# Patient Record
Sex: Male | Born: 1950 | Race: White | Hispanic: No | Marital: Married
Health system: Southern US, Community
[De-identification: ages and names within clinical notes are randomized; demographics above are authoritative.]

---

## 2017-09-25 ENCOUNTER — Other Ambulatory Visit (HOSPITAL_COMMUNITY): Payer: Self-pay

## 2017-09-25 ENCOUNTER — Inpatient Hospital Stay
Admission: RE | Admit: 2017-09-25 | Discharge: 2017-10-31 | Disposition: A | Discharge status: Home Health | Payer: Self-pay | Attending: Internal Medicine | Admitting: Internal Medicine

## 2017-09-25 DIAGNOSIS — J9690 Respiratory failure, unspecified, unspecified whether with hypoxia or hypercapnia: Secondary | ICD-10-CM

## 2017-09-25 DIAGNOSIS — I774 Celiac artery compression syndrome: Secondary | ICD-10-CM

## 2017-09-25 DIAGNOSIS — J869 Pyothorax without fistula: Secondary | ICD-10-CM

## 2017-09-25 DIAGNOSIS — Z931 Gastrostomy status: Secondary | ICD-10-CM

## 2017-09-25 DIAGNOSIS — J90 Pleural effusion, not elsewhere classified: Secondary | ICD-10-CM

## 2017-09-25 DIAGNOSIS — T85598A Other mechanical complication of other gastrointestinal prosthetic devices, implants and grafts, initial encounter: Secondary | ICD-10-CM

## 2017-09-25 DIAGNOSIS — J9382 Other air leak: Secondary | ICD-10-CM

## 2017-09-25 DIAGNOSIS — R109 Unspecified abdominal pain: Secondary | ICD-10-CM

## 2017-09-25 MED ORDER — IOPAMIDOL (ISOVUE-300) INJECTION 61%
INTRAVENOUS | Status: AC
  Administered 2017-09-25: 40 mL via JEJUNOSTOMY
  Filled 2017-09-25: qty 50

## 2017-09-26 LAB — CBC WITH DIFFERENTIAL/PLATELET
Basophils Absolute: 0.1 10*3/uL (ref 0.0–0.1)
Basophils Relative: 1 %
Eosinophils Absolute: 0.6 10*3/uL (ref 0.0–0.7)
Eosinophils Relative: 6 %
HCT: 31.4 % — ABNORMAL LOW (ref 39.0–52.0)
HEMOGLOBIN: 10.3 g/dL — AB (ref 13.0–17.0)
LYMPHS PCT: 26 %
Lymphs Abs: 2.8 10*3/uL (ref 0.7–4.0)
MCH: 28.1 pg (ref 26.0–34.0)
MCHC: 32.8 g/dL (ref 30.0–36.0)
MCV: 85.6 fL (ref 78.0–100.0)
MONOS PCT: 13 %
Monocytes Absolute: 1.4 10*3/uL — ABNORMAL HIGH (ref 0.1–1.0)
NEUTROS ABS: 5.7 10*3/uL (ref 1.7–7.7)
Neutrophils Relative %: 54 %
Platelets: 511 10*3/uL — ABNORMAL HIGH (ref 150–400)
RBC: 3.67 MIL/uL — ABNORMAL LOW (ref 4.22–5.81)
RDW: 15.9 % — ABNORMAL HIGH (ref 11.5–15.5)
WBC: 10.6 10*3/uL — ABNORMAL HIGH (ref 4.0–10.5)

## 2017-09-26 LAB — COMPREHENSIVE METABOLIC PANEL
ALBUMIN: 2.2 g/dL — AB (ref 3.5–5.0)
ALT: 22 U/L (ref 17–63)
AST: 34 U/L (ref 15–41)
Alkaline Phosphatase: 147 U/L — ABNORMAL HIGH (ref 38–126)
Anion gap: 10 (ref 5–15)
BUN: 10 mg/dL (ref 6–20)
CHLORIDE: 98 mmol/L — AB (ref 101–111)
CO2: 23 mmol/L (ref 22–32)
CREATININE: 0.86 mg/dL (ref 0.61–1.24)
Calcium: 8.7 mg/dL — ABNORMAL LOW (ref 8.9–10.3)
GFR calc non Af Amer: >60 mL/min (ref >60)
Glucose, Bld: 95 mg/dL (ref 65–99)
Potassium: 4.1 mmol/L (ref 3.5–5.1)
SODIUM: 131 mmol/L — AB (ref 135–145)
Total Bilirubin: 0.9 mg/dL (ref 0.3–1.2)
Total Protein: 6.9 g/dL (ref 6.5–8.1)

## 2017-09-26 LAB — PROTIME-INR
INR: 1.03
Prothrombin Time: 13.4 seconds (ref 11.4–15.2)

## 2017-09-27 ENCOUNTER — Other Ambulatory Visit (HOSPITAL_COMMUNITY): Payer: Self-pay

## 2017-09-27 LAB — BASIC METABOLIC PANEL
ANION GAP: 7 (ref 5–15)
BUN: 11 mg/dL (ref 6–20)
CALCIUM: 8.2 mg/dL — AB (ref 8.9–10.3)
CO2: 25 mmol/L (ref 22–32)
Chloride: 100 mmol/L — ABNORMAL LOW (ref 101–111)
Creatinine, Ser: 0.75 mg/dL (ref 0.61–1.24)
GFR calc Af Amer: >60 mL/min (ref >60)
GFR calc non Af Amer: >60 mL/min (ref >60)
GLUCOSE: 149 mg/dL — AB (ref 65–99)
Potassium: 4.1 mmol/L (ref 3.5–5.1)
Sodium: 132 mmol/L — ABNORMAL LOW (ref 135–145)

## 2017-09-28 ENCOUNTER — Other Ambulatory Visit (HOSPITAL_BASED_OUTPATIENT_CLINIC_OR_DEPARTMENT_OTHER): Payer: Self-pay

## 2017-09-28 ENCOUNTER — Encounter: Payer: Self-pay | Admitting: *Deleted

## 2017-09-28 ENCOUNTER — Other Ambulatory Visit (HOSPITAL_COMMUNITY): Payer: Self-pay

## 2017-09-28 DIAGNOSIS — I509 Heart failure, unspecified: Secondary | ICD-10-CM

## 2017-09-28 LAB — BASIC METABOLIC PANEL
Anion gap: 8 (ref 5–15)
BUN: 12 mg/dL (ref 6–20)
CHLORIDE: 100 mmol/L — AB (ref 101–111)
CO2: 24 mmol/L (ref 22–32)
CREATININE: 0.72 mg/dL (ref 0.61–1.24)
Calcium: 8.2 mg/dL — ABNORMAL LOW (ref 8.9–10.3)
GFR calc Af Amer: >60 mL/min (ref >60)
GFR calc non Af Amer: >60 mL/min (ref >60)
Glucose, Bld: 131 mg/dL — ABNORMAL HIGH (ref 65–99)
Potassium: 4.2 mmol/L (ref 3.5–5.1)
Sodium: 132 mmol/L — ABNORMAL LOW (ref 135–145)

## 2017-09-28 LAB — BRAIN NATRIURETIC PEPTIDE: B Natriuretic Peptide: 139.5 pg/mL — ABNORMAL HIGH (ref 0.0–100.0)

## 2017-09-28 MED ORDER — IOPAMIDOL (ISOVUE-300) INJECTION 61%
INTRAVENOUS | Status: AC
  Administered 2017-09-28: 75 mL
  Filled 2017-09-28: qty 75

## 2017-09-28 NOTE — Progress Notes (Signed)
  Echocardiogram 2D Echocardiogram has been performed.  Johnny SkeenVijay  [name] 09/28/2017, 2:16 PM

## 2017-09-30 LAB — URINALYSIS, ROUTINE W REFLEX MICROSCOPIC
Bilirubin Urine: NEGATIVE
GLUCOSE, UA: NEGATIVE mg/dL
KETONES UR: NEGATIVE mg/dL
Leukocytes, UA: NEGATIVE
Nitrite: NEGATIVE
PROTEIN: 30 mg/dL — AB
Specific Gravity, Urine: 1.018 (ref 1.005–1.030)
pH: 7 (ref 5.0–8.0)

## 2017-09-30 LAB — CBC
HEMATOCRIT: 34.4 % — AB (ref 39.0–52.0)
HEMOGLOBIN: 10.7 g/dL — AB (ref 13.0–17.0)
MCH: 26.8 pg (ref 26.0–34.0)
MCHC: 31.1 g/dL (ref 30.0–36.0)
MCV: 86.2 fL (ref 78.0–100.0)
Platelets: 593 10*3/uL — ABNORMAL HIGH (ref 150–400)
RBC: 3.99 MIL/uL — ABNORMAL LOW (ref 4.22–5.81)
RDW: 16 % — ABNORMAL HIGH (ref 11.5–15.5)
WBC: 13.1 10*3/uL — ABNORMAL HIGH (ref 4.0–10.5)

## 2017-09-30 LAB — BASIC METABOLIC PANEL
ANION GAP: 8 (ref 5–15)
BUN: 9 mg/dL (ref 6–20)
CHLORIDE: 100 mmol/L — AB (ref 101–111)
CO2: 23 mmol/L (ref 22–32)
Calcium: 8.4 mg/dL — ABNORMAL LOW (ref 8.9–10.3)
Creatinine, Ser: 0.67 mg/dL (ref 0.61–1.24)
GFR calc Af Amer: >60 mL/min (ref >60)
GLUCOSE: 141 mg/dL — AB (ref 65–99)
POTASSIUM: 4.4 mmol/L (ref 3.5–5.1)
Sodium: 131 mmol/L — ABNORMAL LOW (ref 135–145)

## 2017-09-30 LAB — MAGNESIUM: Magnesium: 2.2 mg/dL (ref 1.7–2.4)

## 2017-10-01 LAB — URINE CULTURE: CULTURE: NO GROWTH

## 2017-10-01 LAB — OSMOLALITY, URINE: OSMOLALITY UR: 650 mosm/kg (ref 300–900)

## 2017-10-01 LAB — SODIUM, URINE, RANDOM: Sodium, Ur: 122 mmol/L

## 2017-10-02 LAB — MAGNESIUM: MAGNESIUM: 2.1 mg/dL (ref 1.7–2.4)

## 2017-10-02 LAB — BASIC METABOLIC PANEL
Anion gap: 10 (ref 5–15)
BUN: 12 mg/dL (ref 6–20)
CALCIUM: 8.6 mg/dL — AB (ref 8.9–10.3)
CO2: 23 mmol/L (ref 22–32)
CREATININE: 0.73 mg/dL (ref 0.61–1.24)
Chloride: 98 mmol/L — ABNORMAL LOW (ref 101–111)
GFR calc non Af Amer: >60 mL/min (ref >60)
Glucose, Bld: 118 mg/dL — ABNORMAL HIGH (ref 65–99)
Potassium: 5 mmol/L (ref 3.5–5.1)
SODIUM: 131 mmol/L — AB (ref 135–145)

## 2017-10-02 LAB — CBC
HCT: 35.6 % — ABNORMAL LOW (ref 39.0–52.0)
Hemoglobin: 11.5 g/dL — ABNORMAL LOW (ref 13.0–17.0)
MCH: 27.5 pg (ref 26.0–34.0)
MCHC: 32.3 g/dL (ref 30.0–36.0)
MCV: 85.2 fL (ref 78.0–100.0)
PLATELETS: 530 10*3/uL — AB (ref 150–400)
RBC: 4.18 MIL/uL — ABNORMAL LOW (ref 4.22–5.81)
RDW: 15.7 % — AB (ref 11.5–15.5)
WBC: 11.4 10*3/uL — ABNORMAL HIGH (ref 4.0–10.5)

## 2017-10-02 LAB — OSMOLALITY: Osmolality: 281 mOsm/kg (ref 275–295)

## 2017-10-03 LAB — CBC WITH DIFFERENTIAL/PLATELET
Basophils Absolute: 0 10*3/uL (ref 0.0–0.1)
Basophils Relative: 0 %
EOS ABS: 0.9 10*3/uL — AB (ref 0.0–0.7)
Eosinophils Relative: 7 %
HCT: 34.8 % — ABNORMAL LOW (ref 39.0–52.0)
Hemoglobin: 11 g/dL — ABNORMAL LOW (ref 13.0–17.0)
LYMPHS PCT: 20 %
Lymphs Abs: 2.4 10*3/uL (ref 0.7–4.0)
MCH: 27 pg (ref 26.0–34.0)
MCHC: 31.6 g/dL (ref 30.0–36.0)
MCV: 85.5 fL (ref 78.0–100.0)
MONOS PCT: 19 %
Monocytes Absolute: 2.3 10*3/uL — ABNORMAL HIGH (ref 0.1–1.0)
NEUTROS ABS: 6.6 10*3/uL (ref 1.7–7.7)
NEUTROS PCT: 54 %
PLATELETS: 623 10*3/uL — AB (ref 150–400)
RBC: 4.07 MIL/uL — AB (ref 4.22–5.81)
RDW: 15.7 % — ABNORMAL HIGH (ref 11.5–15.5)
WBC: 12.2 10*3/uL — AB (ref 4.0–10.5)

## 2017-10-03 LAB — COMPREHENSIVE METABOLIC PANEL
ALT: 15 U/L — ABNORMAL LOW (ref 17–63)
ANION GAP: 10 (ref 5–15)
AST: 24 U/L (ref 15–41)
Albumin: 2.4 g/dL — ABNORMAL LOW (ref 3.5–5.0)
Alkaline Phosphatase: 140 U/L — ABNORMAL HIGH (ref 38–126)
BUN: 15 mg/dL (ref 6–20)
CHLORIDE: 99 mmol/L — AB (ref 101–111)
CO2: 24 mmol/L (ref 22–32)
CREATININE: 0.75 mg/dL (ref 0.61–1.24)
Calcium: 8.7 mg/dL — ABNORMAL LOW (ref 8.9–10.3)
Glucose, Bld: 134 mg/dL — ABNORMAL HIGH (ref 65–99)
POTASSIUM: 4.3 mmol/L (ref 3.5–5.1)
SODIUM: 133 mmol/L — AB (ref 135–145)
Total Bilirubin: 0.4 mg/dL (ref 0.3–1.2)
Total Protein: 7.1 g/dL (ref 6.5–8.1)

## 2017-10-03 LAB — PROTIME-INR
INR: 1
PROTHROMBIN TIME: 13.1 s (ref 11.4–15.2)

## 2017-10-03 LAB — PHOSPHORUS: PHOSPHORUS: 4.2 mg/dL (ref 2.5–4.6)

## 2017-10-03 LAB — MAGNESIUM: Magnesium: 2.1 mg/dL (ref 1.7–2.4)

## 2017-10-05 ENCOUNTER — Encounter (HOSPITAL_COMMUNITY): Payer: Self-pay | Admitting: Interventional Radiology

## 2017-10-05 ENCOUNTER — Other Ambulatory Visit (HOSPITAL_COMMUNITY): Payer: Self-pay

## 2017-10-05 HISTORY — PX: IR REPLC DUODEN/JEJUNO TUBE PERCUT W/FLUORO: IMG2334

## 2017-10-05 LAB — BASIC METABOLIC PANEL
ANION GAP: 10 (ref 5–15)
BUN: 21 mg/dL — ABNORMAL HIGH (ref 6–20)
CO2: 27 mmol/L (ref 22–32)
Calcium: 8.7 mg/dL — ABNORMAL LOW (ref 8.9–10.3)
Chloride: 98 mmol/L — ABNORMAL LOW (ref 101–111)
Creatinine, Ser: 0.77 mg/dL (ref 0.61–1.24)
GFR calc Af Amer: >60 mL/min (ref >60)
GLUCOSE: 138 mg/dL — AB (ref 65–99)
POTASSIUM: 4.7 mmol/L (ref 3.5–5.1)
Sodium: 135 mmol/L (ref 135–145)

## 2017-10-05 LAB — CBC
HEMATOCRIT: 37.4 % — AB (ref 39.0–52.0)
HEMOGLOBIN: 11.7 g/dL — AB (ref 13.0–17.0)
MCH: 27.1 pg (ref 26.0–34.0)
MCHC: 31.3 g/dL (ref 30.0–36.0)
MCV: 86.6 fL (ref 78.0–100.0)
PLATELETS: 616 10*3/uL — AB (ref 150–400)
RBC: 4.32 MIL/uL (ref 4.22–5.81)
RDW: 16.2 % — ABNORMAL HIGH (ref 11.5–15.5)
WBC: 10.4 10*3/uL (ref 4.0–10.5)

## 2017-10-05 LAB — PHOSPHORUS: Phosphorus: 4.5 mg/dL (ref 2.5–4.6)

## 2017-10-05 LAB — MAGNESIUM: MAGNESIUM: 2.2 mg/dL (ref 1.7–2.4)

## 2017-10-05 MED ORDER — LIDOCAINE VISCOUS 2 % MT SOLN
OROMUCOSAL | Status: AC
  Administered 2017-10-05: 14:00:00
  Filled 2017-10-05: qty 15

## 2017-10-05 MED ORDER — IOPAMIDOL (ISOVUE-300) INJECTION 61%
INTRAVENOUS | Status: AC
  Administered 2017-10-05: 15 mL
  Filled 2017-10-05: qty 50

## 2017-10-08 LAB — RENAL FUNCTION PANEL
ANION GAP: 10 (ref 5–15)
Albumin: 2.5 g/dL — ABNORMAL LOW (ref 3.5–5.0)
BUN: 20 mg/dL (ref 6–20)
CHLORIDE: 101 mmol/L (ref 101–111)
CO2: 26 mmol/L (ref 22–32)
Calcium: 9 mg/dL (ref 8.9–10.3)
Creatinine, Ser: 0.84 mg/dL (ref 0.61–1.24)
GFR calc non Af Amer: >60 mL/min (ref >60)
GLUCOSE: 143 mg/dL — AB (ref 65–99)
Phosphorus: 4.1 mg/dL (ref 2.5–4.6)
Potassium: 4 mmol/L (ref 3.5–5.1)
Sodium: 137 mmol/L (ref 135–145)

## 2017-10-08 LAB — MAGNESIUM: Magnesium: 2.3 mg/dL (ref 1.7–2.4)

## 2017-10-08 LAB — CBC
HCT: 40 % (ref 39.0–52.0)
HEMOGLOBIN: 12.2 g/dL — AB (ref 13.0–17.0)
MCH: 26.4 pg (ref 26.0–34.0)
MCHC: 30.5 g/dL (ref 30.0–36.0)
MCV: 86.6 fL (ref 78.0–100.0)
PLATELETS: 651 10*3/uL — AB (ref 150–400)
RBC: 4.62 MIL/uL (ref 4.22–5.81)
RDW: 16.2 % — ABNORMAL HIGH (ref 11.5–15.5)
WBC: 13.4 10*3/uL — ABNORMAL HIGH (ref 4.0–10.5)

## 2017-10-10 ENCOUNTER — Other Ambulatory Visit (HOSPITAL_COMMUNITY): Payer: Self-pay

## 2017-10-10 LAB — CBC WITH DIFFERENTIAL/PLATELET
BASOS ABS: 0.1 10*3/uL (ref 0.0–0.1)
Basophils Relative: 1 %
EOS ABS: 0.9 10*3/uL — AB (ref 0.0–0.7)
EOS PCT: 7 %
HCT: 36.2 % — ABNORMAL LOW (ref 39.0–52.0)
Hemoglobin: 11.1 g/dL — ABNORMAL LOW (ref 13.0–17.0)
Lymphocytes Relative: 19 %
Lymphs Abs: 2.4 10*3/uL (ref 0.7–4.0)
MCH: 26.1 pg (ref 26.0–34.0)
MCHC: 30.7 g/dL (ref 30.0–36.0)
MCV: 85.2 fL (ref 78.0–100.0)
MONO ABS: 2 10*3/uL — AB (ref 0.1–1.0)
MONOS PCT: 15 %
Neutro Abs: 7.4 10*3/uL (ref 1.7–7.7)
Neutrophils Relative %: 58 %
PLATELETS: 668 10*3/uL — AB (ref 150–400)
RBC: 4.25 MIL/uL (ref 4.22–5.81)
RDW: 15.7 % — AB (ref 11.5–15.5)
WBC: 12.8 10*3/uL — ABNORMAL HIGH (ref 4.0–10.5)

## 2017-10-10 LAB — COMPREHENSIVE METABOLIC PANEL
ALK PHOS: 138 U/L — AB (ref 38–126)
ALT: 15 U/L — AB (ref 17–63)
AST: 25 U/L (ref 15–41)
Albumin: 2.4 g/dL — ABNORMAL LOW (ref 3.5–5.0)
Anion gap: 10 (ref 5–15)
BILIRUBIN TOTAL: 0.4 mg/dL (ref 0.3–1.2)
BUN: 22 mg/dL — AB (ref 6–20)
CALCIUM: 8.9 mg/dL (ref 8.9–10.3)
CHLORIDE: 100 mmol/L — AB (ref 101–111)
CO2: 27 mmol/L (ref 22–32)
CREATININE: 0.86 mg/dL (ref 0.61–1.24)
GFR calc Af Amer: >60 mL/min (ref >60)
Glucose, Bld: 149 mg/dL — ABNORMAL HIGH (ref 65–99)
Potassium: 4.3 mmol/L (ref 3.5–5.1)
Sodium: 137 mmol/L (ref 135–145)
TOTAL PROTEIN: 7.2 g/dL (ref 6.5–8.1)

## 2017-10-10 LAB — PROTIME-INR
INR: 1.07
PROTHROMBIN TIME: 13.9 s (ref 11.4–15.2)

## 2017-10-12 ENCOUNTER — Other Ambulatory Visit (HOSPITAL_COMMUNITY): Payer: Self-pay

## 2017-10-12 LAB — AEROBIC CULTURE  (SUPERFICIAL SPECIMEN)

## 2017-10-12 LAB — AEROBIC CULTURE W GRAM STAIN (SUPERFICIAL SPECIMEN)

## 2017-10-13 ENCOUNTER — Other Ambulatory Visit (HOSPITAL_COMMUNITY): Payer: Self-pay

## 2017-10-13 LAB — CBC
HEMATOCRIT: 37.5 % — AB (ref 39.0–52.0)
HEMOGLOBIN: 11.5 g/dL — AB (ref 13.0–17.0)
MCH: 26.3 pg (ref 26.0–34.0)
MCHC: 30.7 g/dL (ref 30.0–36.0)
MCV: 85.8 fL (ref 78.0–100.0)
Platelets: 702 10*3/uL — ABNORMAL HIGH (ref 150–400)
RBC: 4.37 MIL/uL (ref 4.22–5.81)
RDW: 15.7 % — ABNORMAL HIGH (ref 11.5–15.5)
WBC: 14.4 10*3/uL — ABNORMAL HIGH (ref 4.0–10.5)

## 2017-10-13 LAB — URINALYSIS, ROUTINE W REFLEX MICROSCOPIC
BILIRUBIN URINE: NEGATIVE
Glucose, UA: NEGATIVE mg/dL
Ketones, ur: 5 mg/dL — AB
Leukocytes, UA: NEGATIVE
NITRITE: NEGATIVE
PH: 6 (ref 5.0–8.0)
Protein, ur: NEGATIVE mg/dL
SPECIFIC GRAVITY, URINE: 1.027 (ref 1.005–1.030)
Squamous Epithelial / LPF: NONE SEEN

## 2017-10-13 LAB — BASIC METABOLIC PANEL
Anion gap: 8 (ref 5–15)
BUN: 21 mg/dL — ABNORMAL HIGH (ref 6–20)
CHLORIDE: 104 mmol/L (ref 101–111)
CO2: 26 mmol/L (ref 22–32)
CREATININE: 0.77 mg/dL (ref 0.61–1.24)
Calcium: 9 mg/dL (ref 8.9–10.3)
GFR calc non Af Amer: >60 mL/min (ref >60)
Glucose, Bld: 151 mg/dL — ABNORMAL HIGH (ref 65–99)
Potassium: 4.1 mmol/L (ref 3.5–5.1)
SODIUM: 138 mmol/L (ref 135–145)

## 2017-10-13 LAB — MAGNESIUM: MAGNESIUM: 2.3 mg/dL (ref 1.7–2.4)

## 2017-10-14 LAB — URINE CULTURE: Culture: NO GROWTH

## 2017-10-16 ENCOUNTER — Other Ambulatory Visit (HOSPITAL_COMMUNITY): Payer: Self-pay

## 2017-10-16 LAB — BASIC METABOLIC PANEL
ANION GAP: 11 (ref 5–15)
BUN: 23 mg/dL — ABNORMAL HIGH (ref 6–20)
CHLORIDE: 101 mmol/L (ref 101–111)
CO2: 24 mmol/L (ref 22–32)
Calcium: 9 mg/dL (ref 8.9–10.3)
Creatinine, Ser: 0.74 mg/dL (ref 0.61–1.24)
GFR calc non Af Amer: >60 mL/min (ref >60)
Glucose, Bld: 152 mg/dL — ABNORMAL HIGH (ref 65–99)
Potassium: 4.5 mmol/L (ref 3.5–5.1)
Sodium: 136 mmol/L (ref 135–145)

## 2017-10-16 LAB — CBC
HEMATOCRIT: 37.4 % — AB (ref 39.0–52.0)
HEMOGLOBIN: 11.5 g/dL — AB (ref 13.0–17.0)
MCH: 26.1 pg (ref 26.0–34.0)
MCHC: 30.7 g/dL (ref 30.0–36.0)
MCV: 84.8 fL (ref 78.0–100.0)
Platelets: 683 10*3/uL — ABNORMAL HIGH (ref 150–400)
RBC: 4.41 MIL/uL (ref 4.22–5.81)
RDW: 15.4 % (ref 11.5–15.5)
WBC: 16.2 10*3/uL — ABNORMAL HIGH (ref 4.0–10.5)

## 2017-10-16 MED ORDER — IOPAMIDOL (ISOVUE-300) INJECTION 61%
150.0000 mL | Freq: Once | INTRAVENOUS | Status: AC | PRN
  Administered 2017-10-16: 150 mL via ORAL

## 2017-10-17 LAB — CBC WITH DIFFERENTIAL/PLATELET
BAND NEUTROPHILS: 4 %
BASOS ABS: 0.2 10*3/uL — AB (ref 0.0–0.1)
Basophils Relative: 1 %
Blasts: 0 %
EOS ABS: 0.7 10*3/uL (ref 0.0–0.7)
Eosinophils Relative: 4 %
HEMATOCRIT: 36.6 % — AB (ref 39.0–52.0)
HEMOGLOBIN: 11.3 g/dL — AB (ref 13.0–17.0)
Lymphocytes Relative: 11 %
Lymphs Abs: 1.9 10*3/uL (ref 0.7–4.0)
MCH: 26.4 pg (ref 26.0–34.0)
MCHC: 30.9 g/dL (ref 30.0–36.0)
MCV: 85.5 fL (ref 78.0–100.0)
METAMYELOCYTES PCT: 0 %
MONOS PCT: 12 %
Monocytes Absolute: 2.1 10*3/uL — ABNORMAL HIGH (ref 0.1–1.0)
Myelocytes: 0 %
NEUTROS ABS: 12.7 10*3/uL — AB (ref 1.7–7.7)
Neutrophils Relative %: 68 %
Other: 0 %
Platelets: 695 10*3/uL — ABNORMAL HIGH (ref 150–400)
Promyelocytes Absolute: 0 %
RBC: 4.28 MIL/uL (ref 4.22–5.81)
RDW: 15.8 % — ABNORMAL HIGH (ref 11.5–15.5)
WBC: 17.6 10*3/uL — ABNORMAL HIGH (ref 4.0–10.5)
nRBC: 0 /100 WBC

## 2017-10-17 LAB — COMPREHENSIVE METABOLIC PANEL
ALBUMIN: 2.4 g/dL — AB (ref 3.5–5.0)
ALK PHOS: 159 U/L — AB (ref 38–126)
ALT: 20 U/L (ref 17–63)
AST: 29 U/L (ref 15–41)
Anion gap: 12 (ref 5–15)
BUN: 24 mg/dL — ABNORMAL HIGH (ref 6–20)
CALCIUM: 9.2 mg/dL (ref 8.9–10.3)
CO2: 25 mmol/L (ref 22–32)
CREATININE: 0.71 mg/dL (ref 0.61–1.24)
Chloride: 100 mmol/L — ABNORMAL LOW (ref 101–111)
GFR calc Af Amer: >60 mL/min (ref >60)
GFR calc non Af Amer: >60 mL/min (ref >60)
GLUCOSE: 182 mg/dL — AB (ref 65–99)
Potassium: 4.1 mmol/L (ref 3.5–5.1)
SODIUM: 137 mmol/L (ref 135–145)
Total Bilirubin: 0.4 mg/dL (ref 0.3–1.2)
Total Protein: 7.4 g/dL (ref 6.5–8.1)

## 2017-10-17 LAB — PHOSPHORUS: PHOSPHORUS: 3.6 mg/dL (ref 2.5–4.6)

## 2017-10-17 LAB — MAGNESIUM: Magnesium: 2.2 mg/dL (ref 1.7–2.4)

## 2017-10-17 LAB — PROTIME-INR
INR: 1.28
Prothrombin Time: 15.9 seconds — ABNORMAL HIGH (ref 11.4–15.2)

## 2017-10-17 NOTE — Progress Notes (Signed)
Patient ID: Johnny LennoxBobby Kim, male   DOB: 05-May-1951, 67 y.o.   MRN: [id number] I have reviewed imaging for the purposes of abdominal abscess drainage. The celiac collection is very small and not accessible by percutaneous drainage.

## 2017-10-18 LAB — MAGNESIUM: Magnesium: 2.3 mg/dL (ref 1.7–2.4)

## 2017-10-18 LAB — BASIC METABOLIC PANEL
ANION GAP: 10 (ref 5–15)
BUN: 22 mg/dL — ABNORMAL HIGH (ref 6–20)
CHLORIDE: 102 mmol/L (ref 101–111)
CO2: 25 mmol/L (ref 22–32)
Calcium: 9 mg/dL (ref 8.9–10.3)
Creatinine, Ser: 0.73 mg/dL (ref 0.61–1.24)
GFR calc Af Amer: >60 mL/min (ref >60)
GFR calc non Af Amer: >60 mL/min (ref >60)
GLUCOSE: 164 mg/dL — AB (ref 65–99)
POTASSIUM: 4.2 mmol/L (ref 3.5–5.1)
Sodium: 137 mmol/L (ref 135–145)

## 2017-10-18 LAB — CBC
HEMATOCRIT: 36.1 % — AB (ref 39.0–52.0)
HEMOGLOBIN: 11.4 g/dL — AB (ref 13.0–17.0)
MCH: 26.6 pg (ref 26.0–34.0)
MCHC: 31.6 g/dL (ref 30.0–36.0)
MCV: 84.1 fL (ref 78.0–100.0)
Platelets: 680 10*3/uL — ABNORMAL HIGH (ref 150–400)
RBC: 4.29 MIL/uL (ref 4.22–5.81)
RDW: 15.8 % — ABNORMAL HIGH (ref 11.5–15.5)
WBC: 14.4 10*3/uL — ABNORMAL HIGH (ref 4.0–10.5)

## 2017-10-18 LAB — PHOSPHORUS: Phosphorus: 3.6 mg/dL (ref 2.5–4.6)

## 2017-10-21 LAB — CBC
HEMATOCRIT: 34.1 % — AB (ref 39.0–52.0)
Hemoglobin: 10.2 g/dL — ABNORMAL LOW (ref 13.0–17.0)
MCH: 25.3 pg — AB (ref 26.0–34.0)
MCHC: 29.9 g/dL — AB (ref 30.0–36.0)
MCV: 84.6 fL (ref 78.0–100.0)
Platelets: 663 10*3/uL — ABNORMAL HIGH (ref 150–400)
RBC: 4.03 MIL/uL — ABNORMAL LOW (ref 4.22–5.81)
RDW: 16 % — AB (ref 11.5–15.5)
WBC: 13.6 10*3/uL — ABNORMAL HIGH (ref 4.0–10.5)

## 2017-10-21 LAB — BASIC METABOLIC PANEL
Anion gap: 10 (ref 5–15)
BUN: 22 mg/dL — AB (ref 6–20)
CALCIUM: 9 mg/dL (ref 8.9–10.3)
CO2: 26 mmol/L (ref 22–32)
CREATININE: 0.77 mg/dL (ref 0.61–1.24)
Chloride: 103 mmol/L (ref 101–111)
GFR calc Af Amer: >60 mL/min (ref >60)
GFR calc non Af Amer: >60 mL/min (ref >60)
GLUCOSE: 160 mg/dL — AB (ref 65–99)
Potassium: 4.1 mmol/L (ref 3.5–5.1)
Sodium: 139 mmol/L (ref 135–145)

## 2017-10-21 LAB — PHOSPHORUS: Phosphorus: 3.7 mg/dL (ref 2.5–4.6)

## 2017-10-21 LAB — MAGNESIUM: Magnesium: 2.2 mg/dL (ref 1.7–2.4)

## 2017-10-22 LAB — URINALYSIS, ROUTINE W REFLEX MICROSCOPIC
Bilirubin Urine: NEGATIVE
GLUCOSE, UA: NEGATIVE mg/dL
Ketones, ur: NEGATIVE mg/dL
Nitrite: NEGATIVE
PH: 8 (ref 5.0–8.0)
Protein, ur: 100 mg/dL — AB
SPECIFIC GRAVITY, URINE: 1.023 (ref 1.005–1.030)
SQUAMOUS EPITHELIAL / LPF: NONE SEEN

## 2017-10-23 LAB — CBC
HCT: 36.6 % — ABNORMAL LOW (ref 39.0–52.0)
Hemoglobin: 11.3 g/dL — ABNORMAL LOW (ref 13.0–17.0)
MCH: 26 pg (ref 26.0–34.0)
MCHC: 30.9 g/dL (ref 30.0–36.0)
MCV: 84.1 fL (ref 78.0–100.0)
PLATELETS: 668 10*3/uL — AB (ref 150–400)
RBC: 4.35 MIL/uL (ref 4.22–5.81)
RDW: 15.9 % — ABNORMAL HIGH (ref 11.5–15.5)
WBC: 15 10*3/uL — ABNORMAL HIGH (ref 4.0–10.5)

## 2017-10-23 LAB — URINE CULTURE: CULTURE: NO GROWTH

## 2017-10-24 LAB — CBC WITH DIFFERENTIAL/PLATELET
Basophils Absolute: 0.1 K/uL (ref 0.0–0.1)
Basophils Relative: 1 %
Eosinophils Absolute: 1 K/uL — ABNORMAL HIGH (ref 0.0–0.7)
Eosinophils Relative: 7 %
HCT: 37.8 % — ABNORMAL LOW (ref 39.0–52.0)
Hemoglobin: 11.9 g/dL — ABNORMAL LOW (ref 13.0–17.0)
Lymphocytes Relative: 20 %
Lymphs Abs: 2.7 K/uL (ref 0.7–4.0)
MCH: 26.4 pg (ref 26.0–34.0)
MCHC: 31.5 g/dL (ref 30.0–36.0)
MCV: 84 fL (ref 78.0–100.0)
Monocytes Absolute: 1.6 K/uL — ABNORMAL HIGH (ref 0.1–1.0)
Monocytes Relative: 12 %
Neutro Abs: 8.3 K/uL — ABNORMAL HIGH (ref 1.7–7.7)
Neutrophils Relative %: 60 %
Platelets: 668 K/uL — ABNORMAL HIGH (ref 150–400)
RBC: 4.5 MIL/uL (ref 4.22–5.81)
RDW: 16 % — ABNORMAL HIGH (ref 11.5–15.5)
WBC: 13.7 K/uL — ABNORMAL HIGH (ref 4.0–10.5)

## 2017-10-24 LAB — PROTIME-INR
INR: 1.15
Prothrombin Time: 14.7 s (ref 11.4–15.2)

## 2017-10-26 LAB — BASIC METABOLIC PANEL
ANION GAP: 10 (ref 5–15)
BUN: 22 mg/dL — ABNORMAL HIGH (ref 6–20)
CHLORIDE: 105 mmol/L (ref 101–111)
CO2: 24 mmol/L (ref 22–32)
Calcium: 8.9 mg/dL (ref 8.9–10.3)
Creatinine, Ser: 0.85 mg/dL (ref 0.61–1.24)
GFR calc Af Amer: >60 mL/min (ref >60)
GLUCOSE: 154 mg/dL — AB (ref 65–99)
Potassium: 4.1 mmol/L (ref 3.5–5.1)
Sodium: 139 mmol/L (ref 135–145)

## 2017-10-26 LAB — CBC
HCT: 38 % — ABNORMAL LOW (ref 39.0–52.0)
HEMOGLOBIN: 11.7 g/dL — AB (ref 13.0–17.0)
MCH: 26 pg (ref 26.0–34.0)
MCHC: 30.8 g/dL (ref 30.0–36.0)
MCV: 84.4 fL (ref 78.0–100.0)
Platelets: 640 10*3/uL — ABNORMAL HIGH (ref 150–400)
RBC: 4.5 MIL/uL (ref 4.22–5.81)
RDW: 16.6 % — ABNORMAL HIGH (ref 11.5–15.5)
WBC: 14.5 10*3/uL — ABNORMAL HIGH (ref 4.0–10.5)

## 2017-10-26 LAB — MAGNESIUM: Magnesium: 2.2 mg/dL (ref 1.7–2.4)

## 2017-10-30 LAB — CBC WITH DIFFERENTIAL/PLATELET
Basophils Absolute: 0.1 K/uL (ref 0.0–0.1)
Basophils Relative: 1 %
Eosinophils Absolute: 0.7 K/uL (ref 0.0–0.7)
Eosinophils Relative: 6 %
HCT: 37.3 % — ABNORMAL LOW (ref 39.0–52.0)
Hemoglobin: 12 g/dL — ABNORMAL LOW (ref 13.0–17.0)
Lymphocytes Relative: 24 %
Lymphs Abs: 2.9 K/uL (ref 0.7–4.0)
MCH: 26.6 pg (ref 26.0–34.0)
MCHC: 32.2 g/dL (ref 30.0–36.0)
MCV: 82.7 fL (ref 78.0–100.0)
Monocytes Absolute: 1.3 K/uL — ABNORMAL HIGH (ref 0.1–1.0)
Monocytes Relative: 11 %
Neutro Abs: 7.2 K/uL (ref 1.7–7.7)
Neutrophils Relative %: 58 %
Platelets: 598 K/uL — ABNORMAL HIGH (ref 150–400)
RBC: 4.51 MIL/uL (ref 4.22–5.81)
RDW: 17.1 % — ABNORMAL HIGH (ref 11.5–15.5)
WBC: 12.2 K/uL — ABNORMAL HIGH (ref 4.0–10.5)

## 2017-10-30 LAB — BASIC METABOLIC PANEL WITH GFR
Anion gap: 10 (ref 5–15)
BUN: 19 mg/dL (ref 6–20)
CO2: 25 mmol/L (ref 22–32)
Calcium: 9.1 mg/dL (ref 8.9–10.3)
Chloride: 104 mmol/L (ref 101–111)
Creatinine, Ser: 0.86 mg/dL (ref 0.61–1.24)
GFR calc Af Amer: >60 mL/min
GFR calc non Af Amer: >60 mL/min
Glucose, Bld: 164 mg/dL — ABNORMAL HIGH (ref 65–99)
Potassium: 3.7 mmol/L (ref 3.5–5.1)
Sodium: 139 mmol/L (ref 135–145)

## 2017-10-30 LAB — MAGNESIUM: Magnesium: 2.2 mg/dL (ref 1.7–2.4)

## 2017-10-31 LAB — CBC WITH DIFFERENTIAL/PLATELET
BASOS ABS: 0.1 10*3/uL (ref 0.0–0.1)
Basophils Relative: 1 %
Eosinophils Absolute: 0.8 10*3/uL — ABNORMAL HIGH (ref 0.0–0.7)
Eosinophils Relative: 6 %
HCT: 38.8 % — ABNORMAL LOW (ref 39.0–52.0)
HEMOGLOBIN: 12.4 g/dL — AB (ref 13.0–17.0)
LYMPHS ABS: 3.1 10*3/uL (ref 0.7–4.0)
LYMPHS PCT: 22 %
MCH: 26.5 pg (ref 26.0–34.0)
MCHC: 32 g/dL (ref 30.0–36.0)
MCV: 82.9 fL (ref 78.0–100.0)
MONOS PCT: 11 %
Monocytes Absolute: 1.6 10*3/uL — ABNORMAL HIGH (ref 0.1–1.0)
NEUTROS PCT: 60 %
Neutro Abs: 8.5 10*3/uL — ABNORMAL HIGH (ref 1.7–7.7)
Platelets: 552 10*3/uL — ABNORMAL HIGH (ref 150–400)
RBC: 4.68 MIL/uL (ref 4.22–5.81)
RDW: 17.3 % — ABNORMAL HIGH (ref 11.5–15.5)
WBC: 14.1 10*3/uL — AB (ref 4.0–10.5)

## 2017-10-31 LAB — PROTIME-INR
INR: 1.01
Prothrombin Time: 13.2 seconds (ref 11.4–15.2)

## 2018-08-18 DEATH — deceased

## 2019-09-02 IMAGING — DX DG ABDOMEN 1V
1 series · 1 of 1 positions shown · non-contrast
Comparison: None.

CLINICAL DATA: J-tube placement.

EXAM:
ABDOMEN - 1 VIEW

[abdomen]
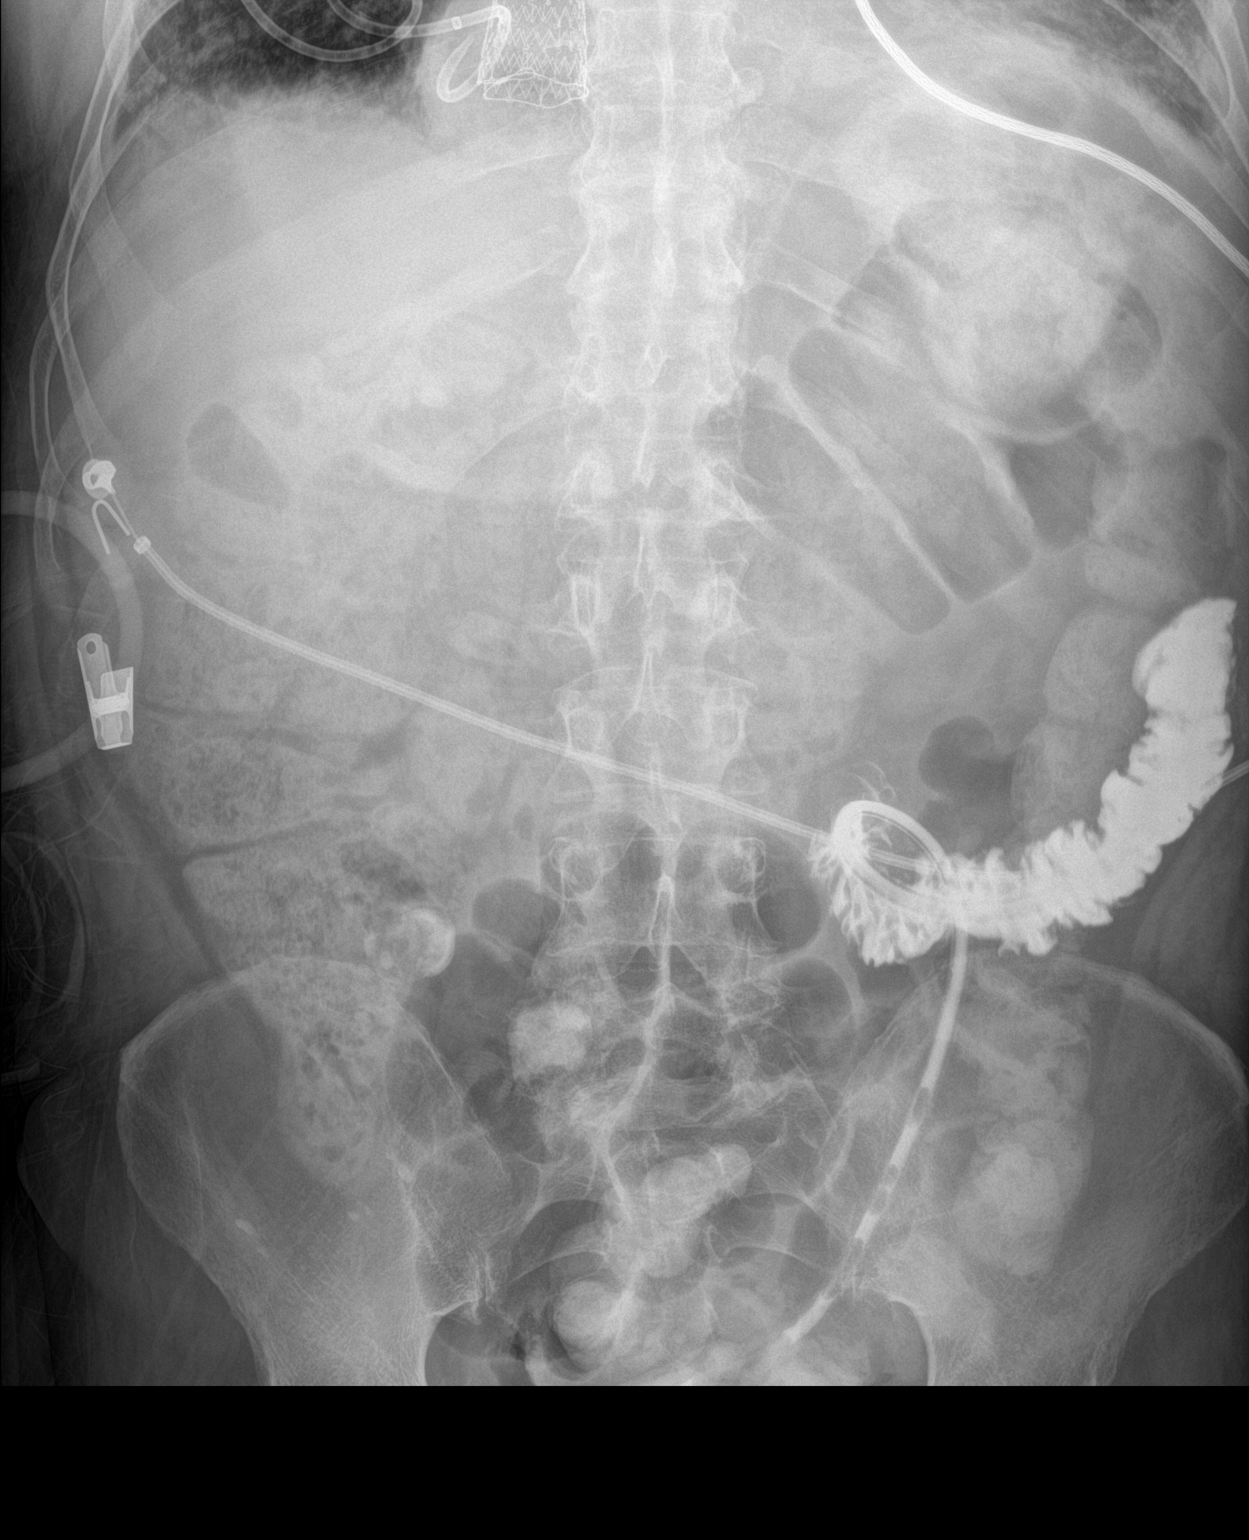

[1 of 1 positions shown; findings below may reference images not displayed]

FINDINGS: Contrast was injected into the patient's J-tube, noted at the left
mid abdomen. There is opacification of the jejunum as expected.
Residual contrast and stool is noted within the colon. The
visualized bowel gas pattern is grossly unremarkable. No acute
osseous abnormalities are seen.
IMPRESSION: Contrast injected into the patient's J-tube is seen filling the
jejunum as expected.

## 2019-09-05 IMAGING — CT CT CHEST W/ CM
2 of 3 series · 15 of 36 positions shown, 18 images · IV contrast (iopamidol)
Comparison: Chest x-ray from yesterday.

CLINICAL DATA: Empyema.

EXAM:
CT CHEST WITH CONTRAST
TECHNIQUE: Multidetector CT imaging of the chest was performed during
intravenous contrast administration.
CONTRAST:  75mL [id number] IOPAMIDOL ([id number]-[id number]) INJECTION 61%

[Series 3: thorax 2.0 i31f 2 · axial · 0.71mm/px · z∈[+1390,+1656]mm · 12 of 157 slices shown, 15 images]
[im 12/157  mediastinal]
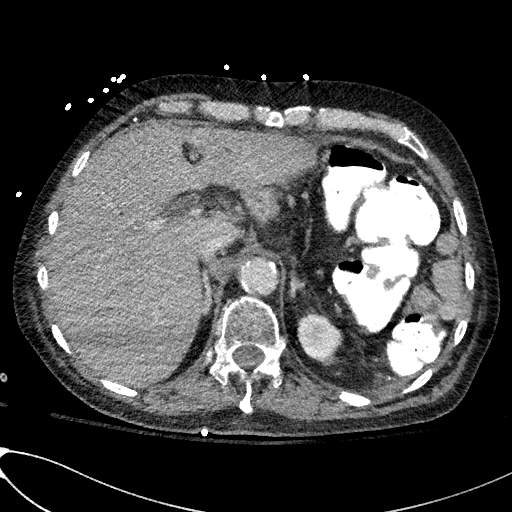
[im 12/157  lung]
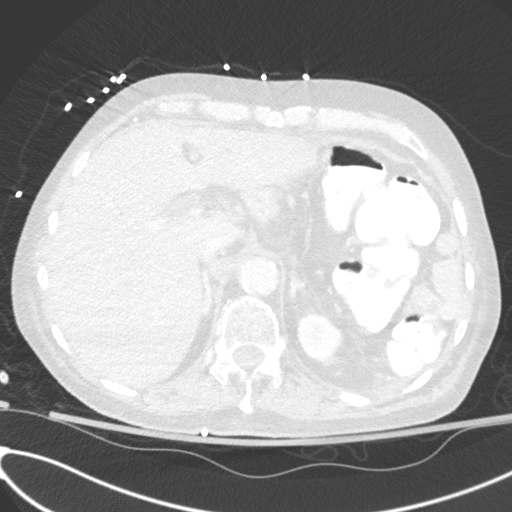
[im 24/157  lung]
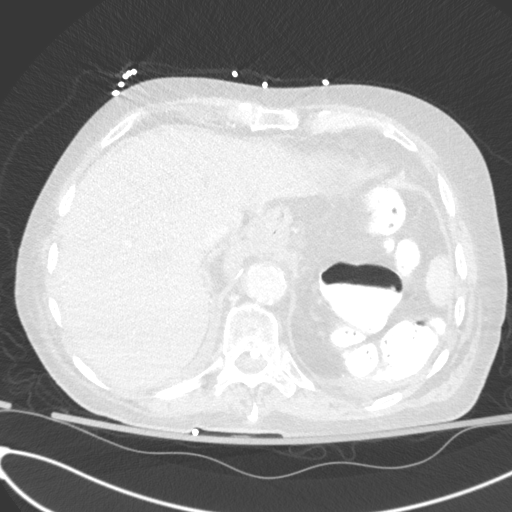
[im 35/157  lung]
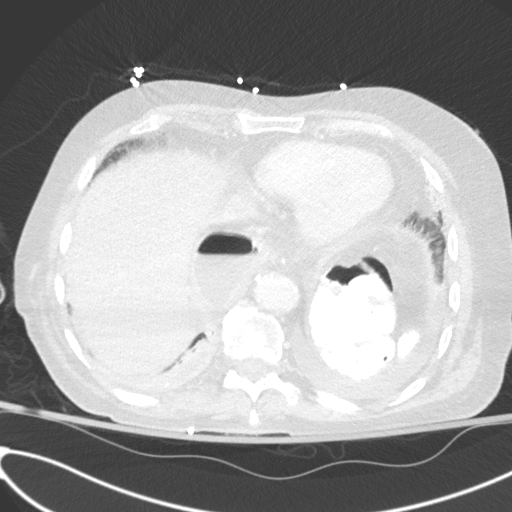
[im 47/157  lung]
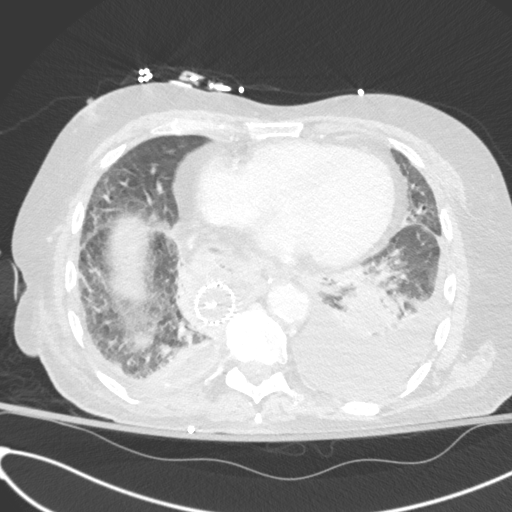
[im 58/157  mediastinal]
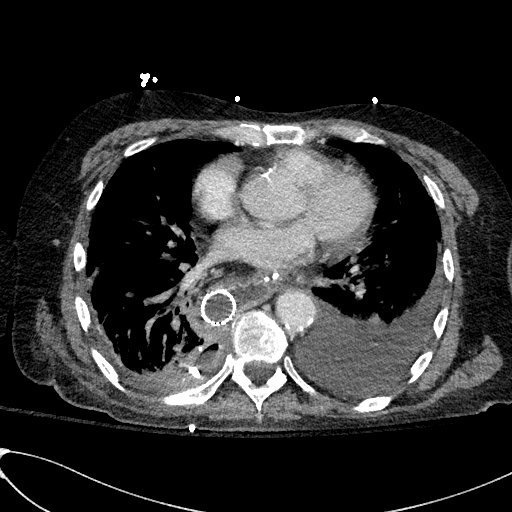
[im 58/157  lung]
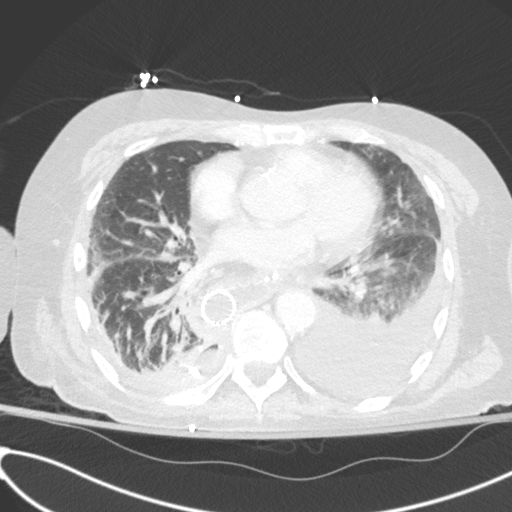
[im 70/157  lung]
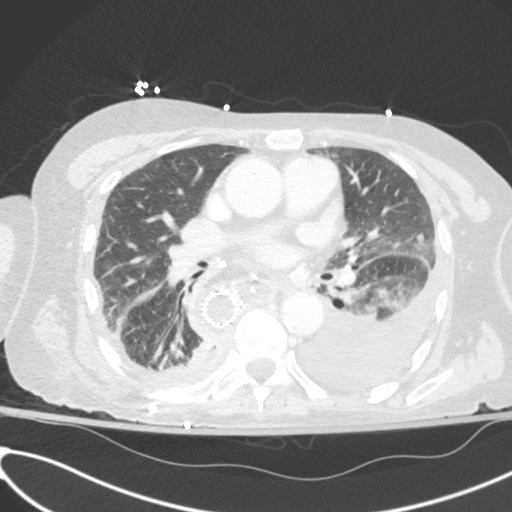
[im 87/157  lung]
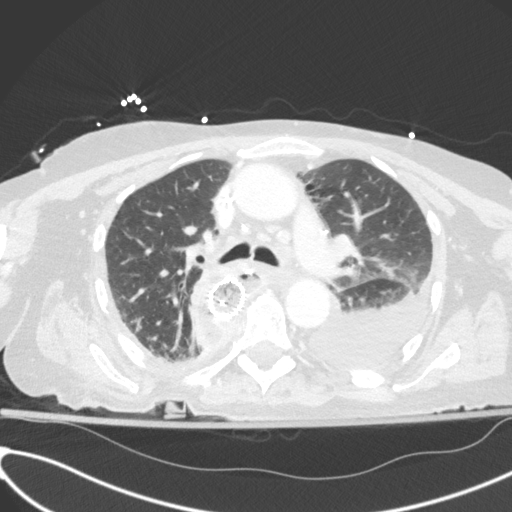
[im 99/157  lung]
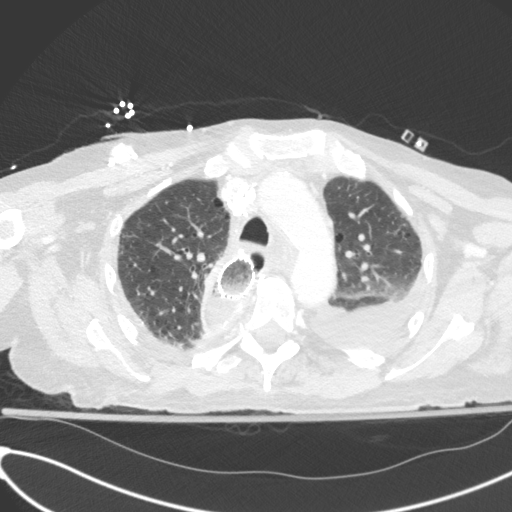
[im 110/157  mediastinal]
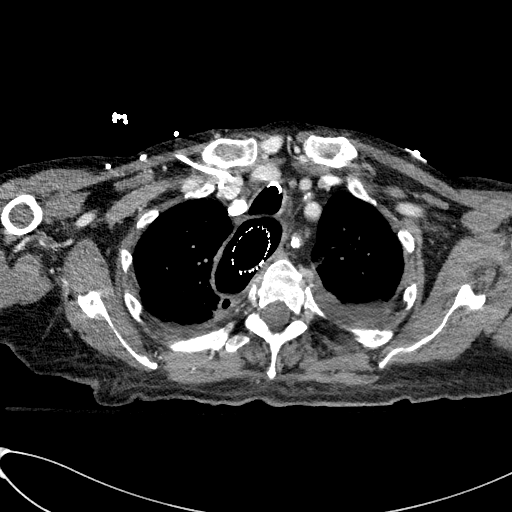
[im 110/157  lung]
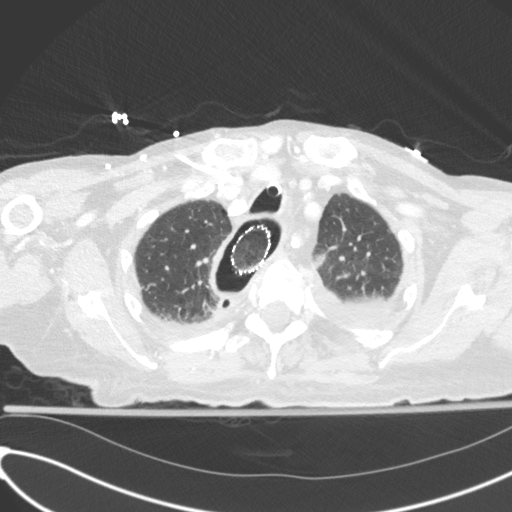
[im 122/157  lung]
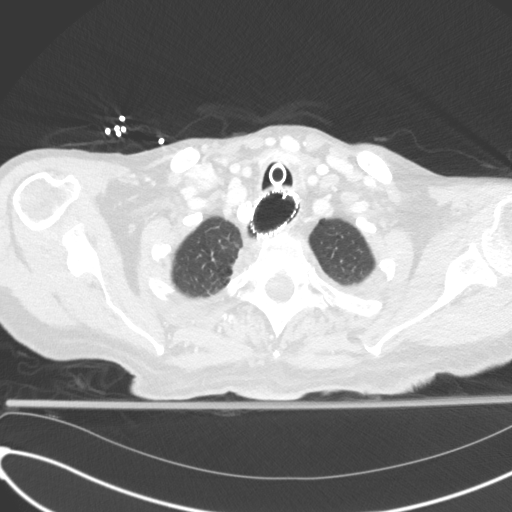
[im 133/157  lung]
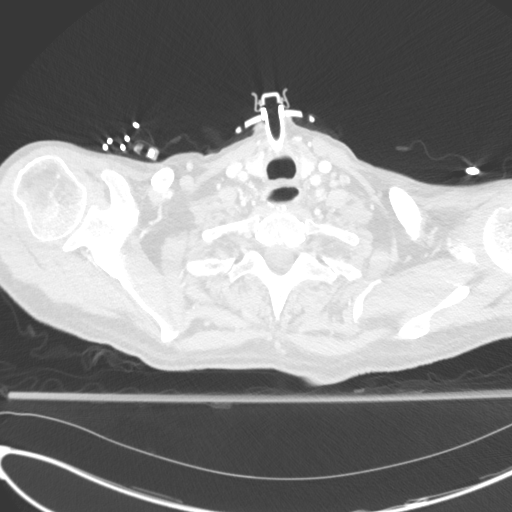
[im 145/157  lung]
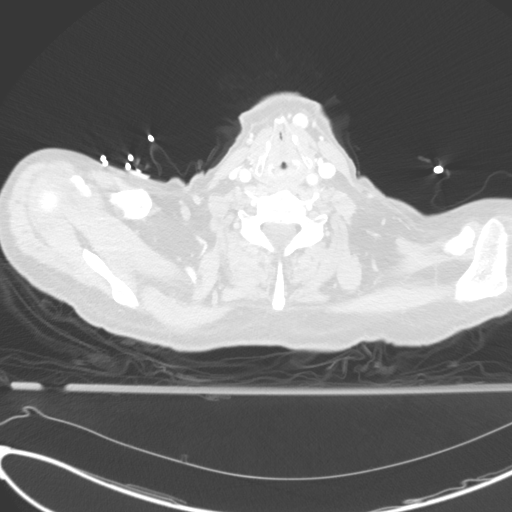

[Series 5: coronal · coronal · 0.60mm/px · 3 of 104 slices shown]
[im 21/104  lung]
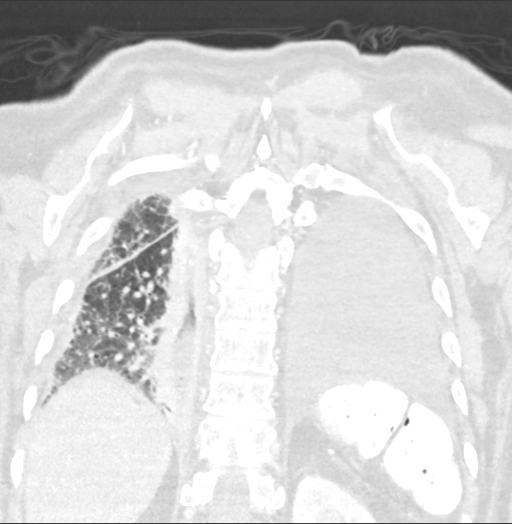
[im 42/104  lung]
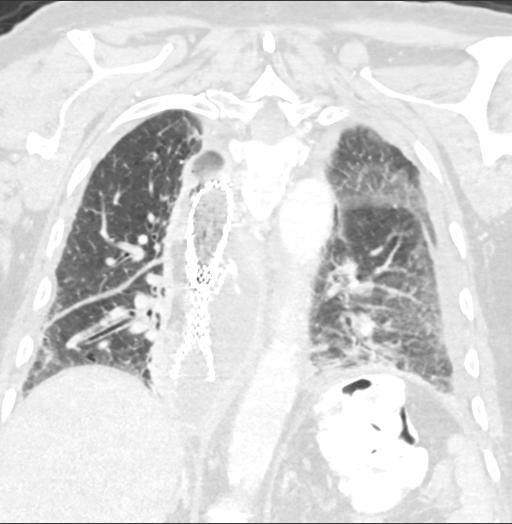
[im 62/104  lung]
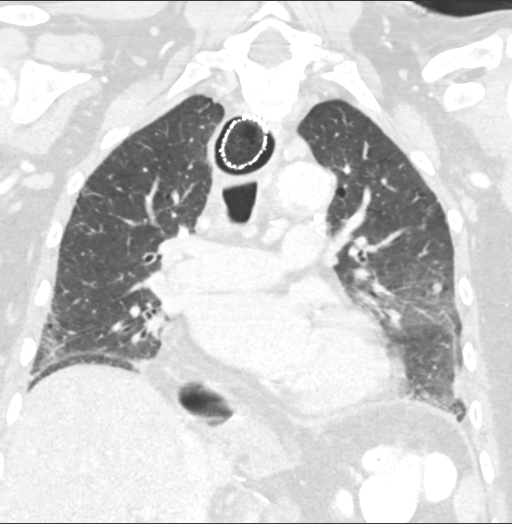

[15 of 36 positions shown; findings below may reference images not displayed]

FINDINGS: Cardiovascular: Normal heart size. No pericardial effusion.
Aneurysmal dilatation of the ascending thoracic aorta, measuring up
to 4.2 cm. Coronary, aortic arch, and branch vessel atherosclerotic
vascular disease. No central pulmonary embolism. Right chest wall
port catheter with the tip at the cavoatrial junction.

Mediastinum/Nodes: Prominent subcentimeter mediastinal lymph nodes
are likely reactive. No axillary or hilar lymphadenopathy. Multiple
calcified mediastinal and hilar lymph nodes. The patient is status
post esophagectomy with gastric pull up. There is a large bore stent
within the gastric pull up.

Lungs/Pleura: There is a thick-walled, rim enhancing fluid
collection in the right pleural space containing an air-fluid level,
consistent with empyema. This measures approximately 1.8 x 5.0 x
15.2 cm (AP by transverse by CC) and appears to communicate with the
gastric pull up (series 3, image 73). There is a pigtail drainage
catheter partially within the empyema, with a few sideholes in the
posterior chest wall.

Moderate left pleural effusion. Tracheostomy in place. Left greater
than right lower lobe atelectasis. Mild interlobular septal
thickening and faint ground-glass opacity at the lung bases. 7 mm
pulmonary nodule in the left upper lobe (series 7, image 87).

Upper Abdomen: No acute abnormality. Cholelithiasis. Prior
splenectomy.

Musculoskeletal: No chest wall abnormality. No acute or significant
osseous findings.
IMPRESSION: 1. Prior esophagectomy with gastric pull up. 1.8 x 5.0 x 15.2 cm (AP
by transverse by CC) empyema within the right posterior pleural
space that appears to communicate with the gastric pull up.
2. Pigtail drainage catheter partially within the empyema, with a
few proximal sideholes in the posterior chest wall.
3. Moderate left pleural effusion. Mild interstitial and alveolar
edema at the lung bases.
4. 7 mm pulmonary nodule in the left upper lobe. Attention on
follow-up imaging.
5. Ascending thoracic aortic aneurysm measuring 4.2 cm. Recommend
annual imaging followup by CTA or MRA. This recommendation follows
0070 ACCF/AHA/AATS/ACR/ASA/SCA/LOUCAS/SILLOO/GORDILLO/DIORA Guidelines for the
Diagnosis and Management of Patients with Thoracic Aortic Disease.
Circulation. 0070; 121: [id number]
6.  Aortic atherosclerosis ([id number]).
